# Patient Record
Sex: Female | Born: 1995 | Race: Black or African American | Hispanic: No | Marital: Single | Smoking: Never smoker
Health system: Southern US, Community
[De-identification: ages and names within clinical notes are randomized; demographics above are authoritative.]

## PROBLEM LIST (undated history)

## (undated) DIAGNOSIS — J45909 Unspecified asthma, uncomplicated: Secondary | ICD-10-CM

## (undated) DIAGNOSIS — M5430 Sciatica, unspecified side: Secondary | ICD-10-CM

---

## 2015-12-25 ENCOUNTER — Emergency Department (HOSPITAL_COMMUNITY): Payer: Federal, State, Local not specified - PPO

## 2015-12-25 ENCOUNTER — Emergency Department (HOSPITAL_COMMUNITY)
Admission: EM | Admit: 2015-12-25 | Discharge: 2015-12-25 | Disposition: A | Discharge status: Home / Self Care | Payer: Federal, State, Local not specified - PPO | Attending: Emergency Medicine | Admitting: Emergency Medicine

## 2015-12-25 ENCOUNTER — Encounter (HOSPITAL_COMMUNITY): Payer: Self-pay | Admitting: Emergency Medicine

## 2015-12-25 DIAGNOSIS — J3489 Other specified disorders of nose and nasal sinuses: Secondary | ICD-10-CM | POA: Diagnosis not present

## 2015-12-25 DIAGNOSIS — R0981 Nasal congestion: Secondary | ICD-10-CM | POA: Diagnosis not present

## 2015-12-25 DIAGNOSIS — J45901 Unspecified asthma with (acute) exacerbation: Secondary | ICD-10-CM | POA: Insufficient documentation

## 2015-12-25 DIAGNOSIS — R05 Cough: Secondary | ICD-10-CM | POA: Diagnosis not present

## 2015-12-25 DIAGNOSIS — Z880 Allergy status to penicillin: Secondary | ICD-10-CM | POA: Insufficient documentation

## 2015-12-25 HISTORY — DX: Unspecified asthma, uncomplicated: J45.909

## 2015-12-25 MED ORDER — FLUTICASONE PROPIONATE 50 MCG/ACT NA SUSP: 2.0000 | Freq: Every day | NASAL | Status: AC

## 2015-12-25 MED ORDER — PSEUDOEPHEDRINE HCL 30 MG PO TABS: 30.0000 mg | ORAL_TABLET | Freq: Four times a day (QID) | ORAL | Status: AC | PRN

## 2015-12-25 NOTE — Discharge Instructions (Signed)
Your chest xray is normal today. Your vital signs are normal. Take an allergy medication of your choice, DAILY, you can try zyrtec, Claritin, allegra, which ever has worked for you in the past. Flonase daily for congestion. Use nasal saline spray every 2 hrs for congestion. You can also try sudafed. Use your inhaler as needed. Please follow up with a primary care doctor or and urgent care.   Allergic Rhinitis Allergic rhinitis is when the mucous membranes in the nose respond to allergens. Allergens are particles in the air that cause your body to have an allergic reaction. This causes you to release allergic antibodies. Through a chain of events, these eventually cause you to release histamine into the blood stream. Although meant to protect the body, it is this release of histamine that causes your discomfort, such as frequent sneezing, congestion, and an itchy, runny nose.  CAUSES Seasonal allergic rhinitis (hay fever) is caused by pollen allergens that may come from grasses, trees, and weeds. Year-round allergic rhinitis (perennial allergic rhinitis) is caused by allergens such as house dust mites, pet dander, and mold spores. SYMPTOMS  Nasal stuffiness (congestion).  Itchy, runny nose with sneezing and tearing of the eyes. DIAGNOSIS Your health care provider can help you determine the allergen or allergens that trigger your symptoms. If you and your health care provider are unable to determine the allergen, skin or blood testing may be used. Your health care provider will diagnose your condition after taking your health history and performing a physical exam. Your health care provider may assess you for other related conditions, such as asthma, pink eye, or an ear infection. TREATMENT Allergic rhinitis does not have a cure, but it can be controlled by:  Medicines that block allergy symptoms. These may include allergy shots, nasal sprays, and oral antihistamines.  Avoiding the allergen. Hay  fever may often be treated with antihistamines in pill or nasal spray forms. Antihistamines block the effects of histamine. There are over-the-counter medicines that may help with nasal congestion and swelling around the eyes. Check with your health care provider before taking or giving this medicine. If avoiding the allergen or the medicine prescribed do not work, there are many new medicines your health care provider can prescribe. Stronger medicine may be used if initial measures are ineffective. Desensitizing injections can be used if medicine and avoidance does not work. Desensitization is when a patient is given ongoing shots until the body becomes less sensitive to the allergen. Make sure you follow up with your health care provider if problems continue. HOME CARE INSTRUCTIONS It is not possible to completely avoid allergens, but you can reduce your symptoms by taking steps to limit your exposure to them. It helps to know exactly what you are allergic to so that you can avoid your specific triggers. SEEK MEDICAL CARE IF:  You have a fever.  You develop a cough that does not stop easily (persistent).  You have shortness of breath.  You start wheezing.  Symptoms interfere with normal daily activities.   This information is not intended to replace advice given to you by your health care provider. Make sure you discuss any questions you have with your health care provider.   Document Released: 09/06/2001 Document Revised: 01/02/2015 Document Reviewed: 08/19/2013 Elsevier Interactive Patient Education [employer] Inc.

## 2015-12-25 NOTE — ED Notes (Signed)
Pt c/o productive cough with yellow/dark yellow sputum, congestion in head and chest x 1.5 months.

## 2015-12-25 NOTE — ED Provider Notes (Signed)
CSN: [id number]     Arrival date & time 12/25/15  1001 History  By signing my name below, I, Tanda RockersMargaux Venter, attest that this documentation has been prepared under the direction and in the presence of Tameisha Covell, PA-C. Electronically Signed: Tanda RockersMargaux Venter, ED Scribe. 12/25/2015. 12:09 PM.  Chief Complaint  Patient presents with  . Cough   The history is provided by the patient. No language interpreter was used.     HPI Comments: Christie Cortez is a 19 y.o. female with hx asthma who presents to the Emergency Department complaining of gradual onset, constant, asthma exacerbation including nasal congestion and productive cough with yellow sputum x 1.5 months, gradually worsening. Pt has been seen in the ED in [place] for the same symptoms and was given a prescription for Prednisone and allergy medication without relief. Pt cannot recall what the name of the allergy medication was. She has also used her albuterol inhaler 4-5 times since onset without relief. Denies fever, chills, or any other associated symptoms.  Past Medical History  Diagnosis Date  . Asthma    History reviewed. No pertinent past surgical history. History reviewed. No pertinent family history. Social History  Substance Use Topics  . Smoking status: Never Smoker   . Smokeless tobacco: None  . Alcohol Use: Yes     Comment: rarely   OB History    No data available     Review of Systems  Constitutional: Negative for fever and chills.  HENT: Positive for congestion.   Respiratory: Positive for cough.    Allergies  Coconut oil and Penicillins  Home Medications   Prior to Admission medications   Not on File   Triage Vitals: BP 124/81 mmHg  Pulse 85  Temp(Src) 98 F (36.7 C) (Oral)  Resp 18  SpO2 100%  LMP 12/25/2015   Physical Exam  Constitutional: She is oriented to person, place, and time. She appears well-developed and well-nourished. No distress.  HENT:  Head: Normocephalic and atraumatic.   Right Ear: Tympanic membrane, external ear and ear canal normal.  Left Ear: Tympanic membrane, external ear and ear canal normal.  Nose: Mucosal edema and rhinorrhea present.  Mouth/Throat: Uvula is midline and oropharynx is clear and moist. No oropharyngeal exudate.  Eyes: Conjunctivae and EOM are normal.  Neck: Neck supple. No tracheal deviation present.  Cardiovascular: Normal rate, regular rhythm and normal heart sounds.   Pulmonary/Chest: Effort normal and breath sounds normal. No respiratory distress. She has no wheezes. She has no rales.  Musculoskeletal: Normal range of motion.  Neurological: She is alert and oriented to person, place, and time.  Skin: Skin is warm and dry.  Psychiatric: She has a normal mood and affect. Her behavior is normal.  Nursing note and vitals reviewed.   ED Course  Procedures (including critical care time)   DIAGNOSTIC STUDIES: Oxygen Saturation is 100% on RA, normal by my interpretation.    COORDINATION OF CARE: 12:03 PM-Discussed treatment plan which includes allergy medication with pt at bedside and pt agreed to plan.   Labs Review Labs Reviewed - No data to display  Imaging Review Dg Chest 2 View  12/25/2015  CLINICAL DATA:  Productive cough and chest congestion. EXAM: CHEST  2 VIEW COMPARISON:  None. FINDINGS: There is slight peribronchial thickening. The lungs are otherwise clear. Heart size and vascularity are normal. No effusions. Normal bones. IMPRESSION: Slight bronchitic changes. Electronically Signed   By: Francene BoyersJames  Maxwell M.D.   On: 12/25/2015 11:47   I  have personally reviewed and evaluated these images as part of my medical decision-making.   EKG Interpretation None      MDM   Final diagnoses:  Nasal congestion   Pt with hx of asthma, here for nasal congestion and cough for close to 2 months. She has been seen in ED in [place] for the same and prescribed prednisone course and allergy medications which she states are not  helping. Pt currently not taking anything for congestion. Using inhaler with no relief of her symptoms. Lungs are clear. VS are normal. Main finding on exam is nasal congestion. Pt's oxygen sat is 100% on RA. Afebrile. Most likely cough from allergies and post nasal drainage. Home with allergy medication, flonase, sudafed, follow up with pcp.   Filed Vitals:   12/25/15 1053 12/25/15 1220  BP: 124/81 125/76  Pulse: 85 83  Temp: 98 F (36.7 C)   TempSrc: Oral   Resp: 18 18  SpO2: 100% 100%    I personally performed the services described in this documentation, which was scribed in my presence. The recorded information has been reviewed and is accurate.     Jaynie Crumble, PA-C 12/25/15 1323  Benjiman Core, MD 12/25/15 [phone]

## 2017-05-12 IMAGING — CR DG CHEST 2V
2 series · 2 of 2 positions shown · non-contrast
Comparison: None.

CLINICAL DATA: Productive cough and chest congestion.

EXAM:
CHEST  2 VIEW

[w chest pa]
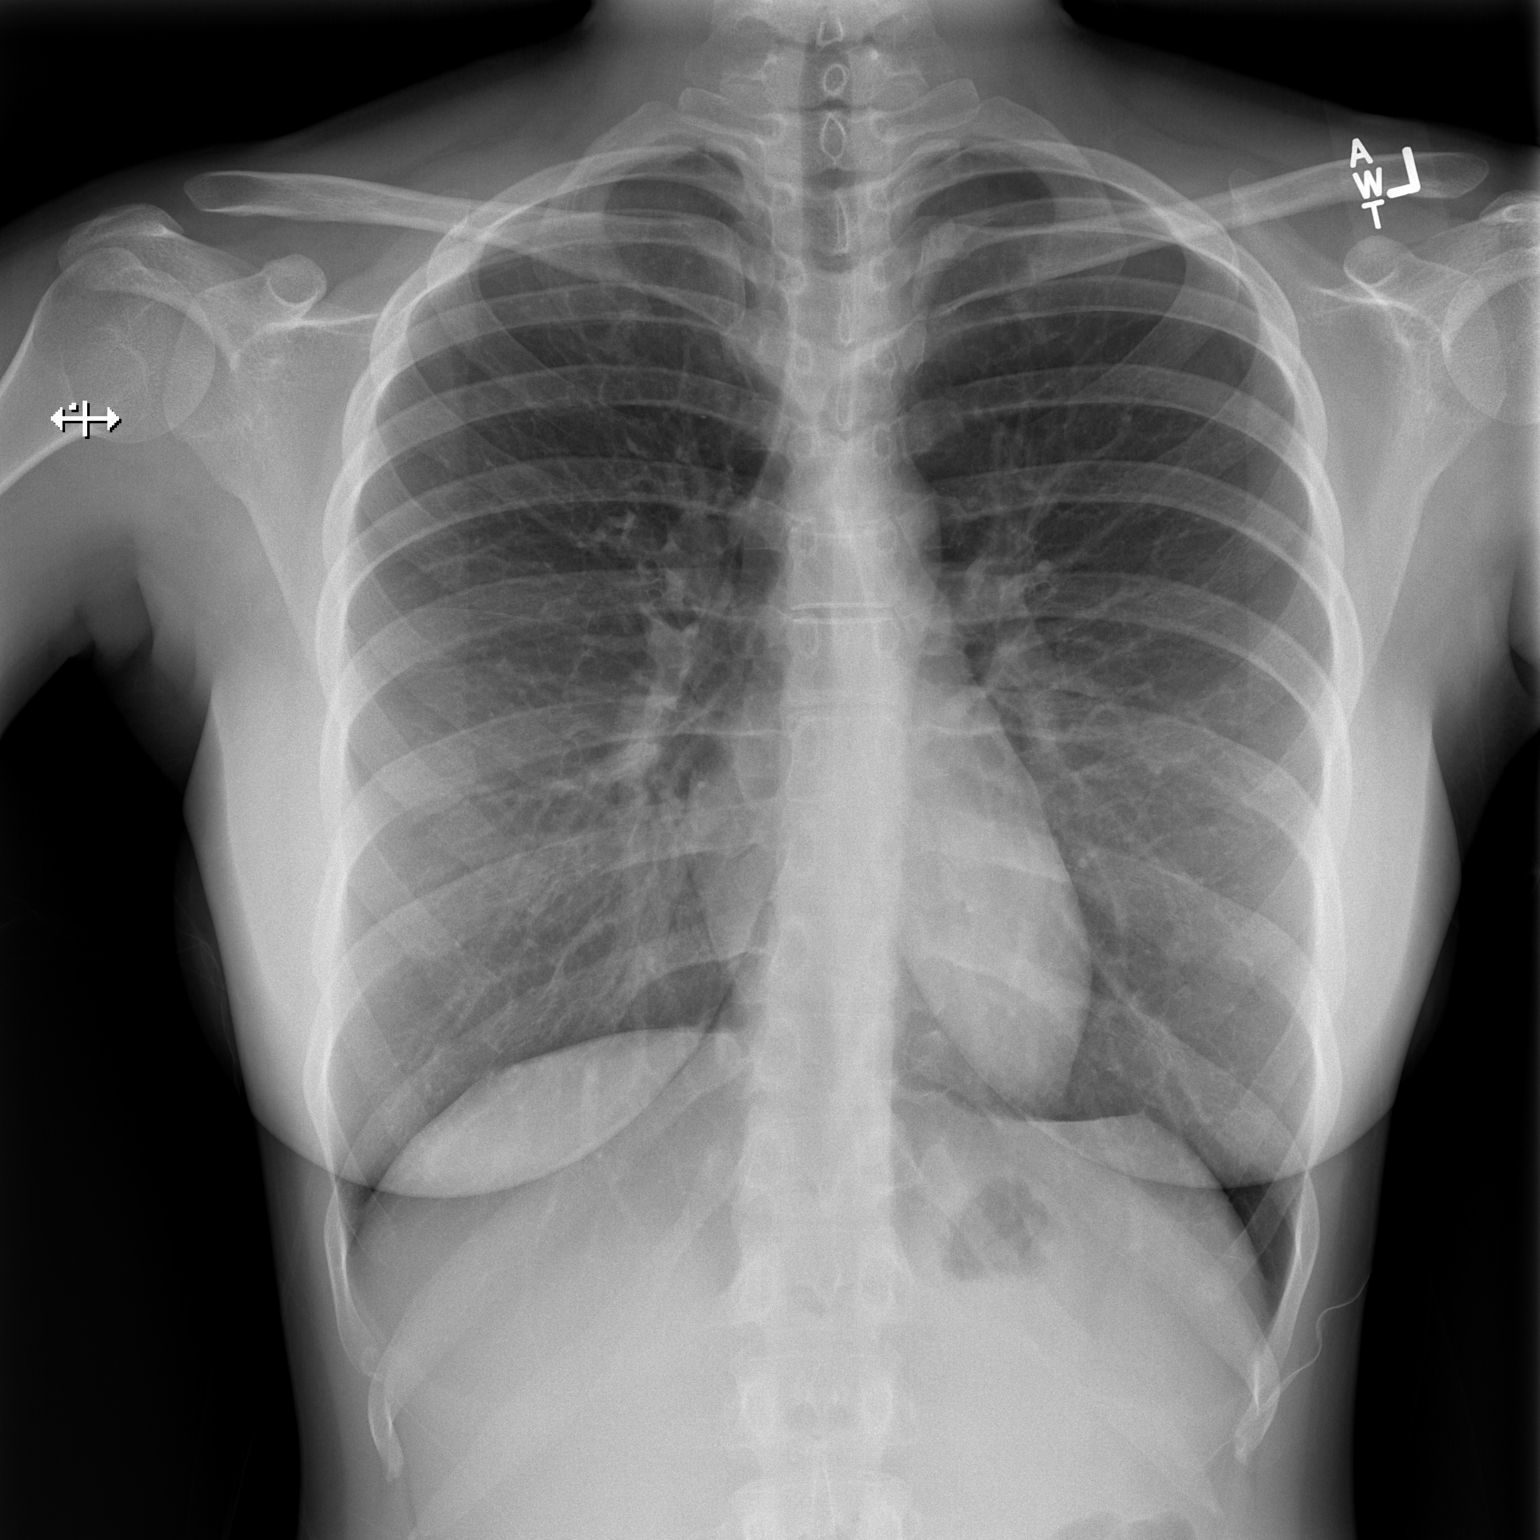

[w chest lat]
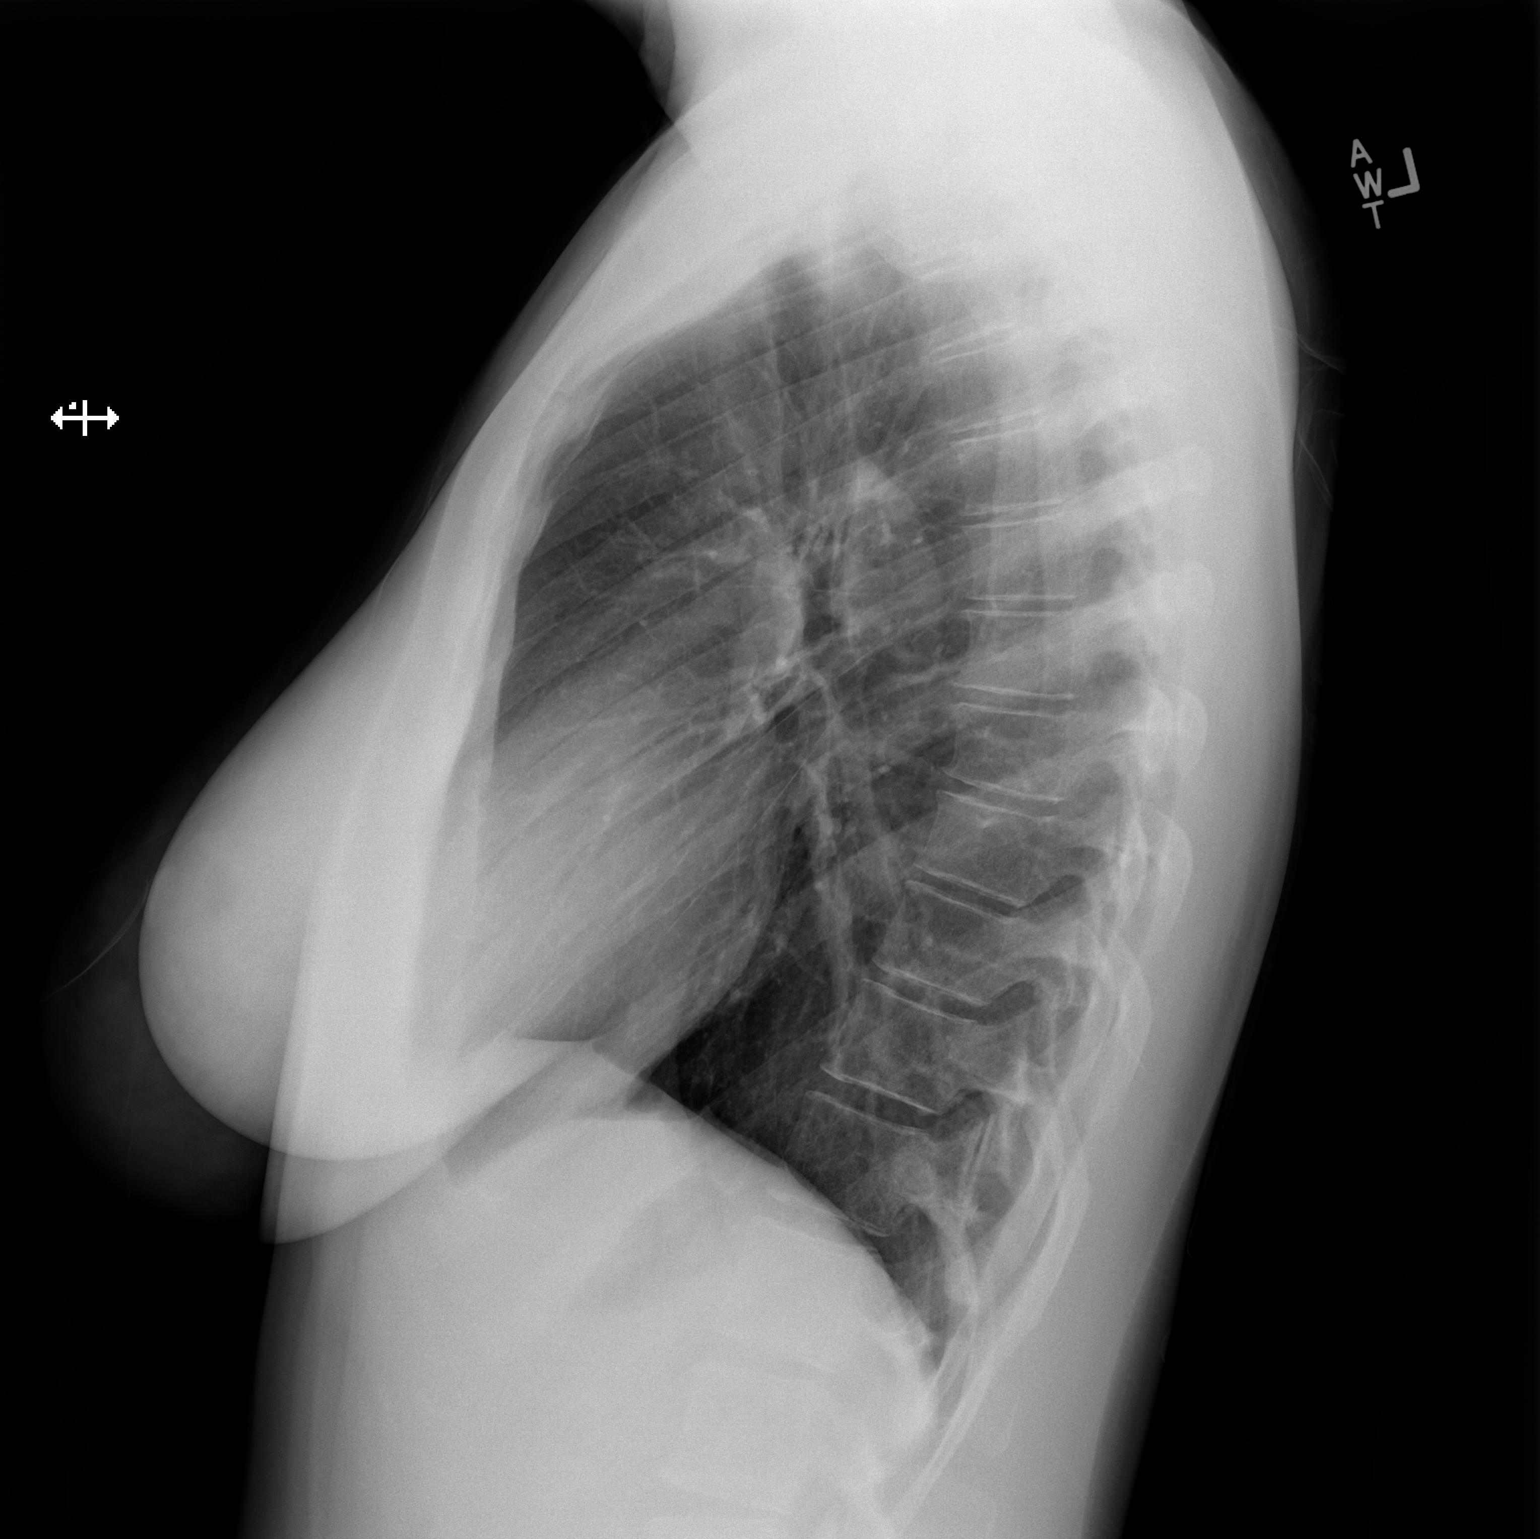

[2 of 2 positions shown; findings below may reference images not displayed]

FINDINGS: There is slight peribronchial thickening. The lungs are otherwise
clear. Heart size and vascularity are normal. No effusions. Normal
bones.
IMPRESSION: Slight bronchitic changes.

## 2019-07-19 ENCOUNTER — Other Ambulatory Visit: Payer: Self-pay

## 2019-07-19 DIAGNOSIS — Z20822 Contact with and (suspected) exposure to covid-19: Secondary | ICD-10-CM

## 2019-07-22 LAB — NOVEL CORONAVIRUS, NAA: SARS-CoV-2, NAA: DETECTED — AB

## 2019-07-30 ENCOUNTER — Other Ambulatory Visit: Payer: Self-pay

## 2019-07-30 DIAGNOSIS — Z20822 Contact with and (suspected) exposure to covid-19: Secondary | ICD-10-CM

## 2019-07-31 LAB — NOVEL CORONAVIRUS, NAA: SARS-CoV-2, NAA: NOT DETECTED

## 2024-12-14 ENCOUNTER — Encounter (HOSPITAL_BASED_OUTPATIENT_CLINIC_OR_DEPARTMENT_OTHER): Payer: Self-pay | Admitting: Emergency Medicine

## 2024-12-14 ENCOUNTER — Other Ambulatory Visit: Payer: Self-pay

## 2024-12-14 ENCOUNTER — Emergency Department (HOSPITAL_BASED_OUTPATIENT_CLINIC_OR_DEPARTMENT_OTHER)
Admission: EM | Admit: 2024-12-14 | Discharge: 2024-12-14 | Disposition: A | Discharge status: Home / Self Care | Attending: Emergency Medicine | Admitting: Emergency Medicine

## 2024-12-14 DIAGNOSIS — M545 Low back pain, unspecified: Secondary | ICD-10-CM | POA: Diagnosis present

## 2024-12-14 DIAGNOSIS — M5442 Lumbago with sciatica, left side: Secondary | ICD-10-CM | POA: Insufficient documentation

## 2024-12-14 HISTORY — DX: Sciatica, unspecified side: M54.30

## 2024-12-14 LAB — URINALYSIS, MICROSCOPIC (REFLEX): RBC / HPF: >50 RBC/hpf (ref 0–5)

## 2024-12-14 LAB — URINALYSIS, ROUTINE W REFLEX MICROSCOPIC
Bilirubin Urine: NEGATIVE
Glucose, UA: NEGATIVE mg/dL
Ketones, ur: NEGATIVE mg/dL
Leukocytes,Ua: NEGATIVE
Nitrite: NEGATIVE
Protein, ur: NEGATIVE mg/dL
Specific Gravity, Urine: 1.02 (ref 1.005–1.030)
pH: 6 (ref 5.0–8.0)

## 2024-12-14 LAB — PREGNANCY, URINE: Preg Test, Ur: NEGATIVE

## 2024-12-14 MED ORDER — KETOROLAC TROMETHAMINE 15 MG/ML IJ SOLN
15.0000 mg | Freq: Once | INTRAMUSCULAR | Status: AC
  Administered 2024-12-14: 15 mg via INTRAMUSCULAR
  Filled 2024-12-14: qty 1

## 2024-12-14 MED ORDER — PREDNISONE 10 MG PO TABS: ORAL_TABLET | ORAL | 0 refills | Status: AC

## 2024-12-14 MED ORDER — DEXAMETHASONE SOD PHOSPHATE PF 10 MG/ML IJ SOLN
10.0000 mg | Freq: Once | INTRAMUSCULAR | Status: AC
  Administered 2024-12-14: 10 mg via INTRAMUSCULAR

## 2024-12-14 NOTE — ED Notes (Signed)
 Report received from Anissa, CHAR[facility] FUNDRAISER. Assuming patient care at this time.
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
## 2024-12-14 NOTE — Discharge Instructions (Addendum)
 Your back and leg pain seems due to an irritated nerve.   Please engage in light physical activity (like walking) to prevent your back pain from worsening and to prevent stiffness. Refrain from bedrest which can make your pain worse.   You may use up to 600mg  ibuprofen every 6 hours as needed for pain.  Do not exceed 2.4g of ibuprofen per day.  You were given a dose of a similar medication here today, your next dose can be no sooner than 8 PM tonight.  You may take up to 1000mg  of tylenol every 6 hours as needed for pain.  Do not take more then 4g per day.    You may use a heating pack on your back to help with the pain.  You may use your home muscle relaxer (Flexeril OR Tizanidine) before bed as needed for muscle pain. This medication can be sedating. Do not drive or operate heavy machinery after taking this medicine. Do not drink alcohol or take other sedating medications when taking this medicine for safety reasons.  Keep this out of reach of small children.    You have been prescribed prednisone  which is an anti-inflammatory that can help with nerve pain.  Please take this medication as prescribed for the next 6 days starting tomorrow morning (40mg  on day 1, 30mg  on days 2 and 3, 20mg  on days 4 and 5, 10mg  on day 6). Take this medication in the morning with breakfast, as taking it at night may make it hard to sleep. If you are a diabetic, please monitor your blood sugars closely on this medication, as it can cause your blood sugar to rise.   Please schedule follow-up appointment with your PCP or the spine specialist listed below if pain is not starting to improve within the next 2 weeks.  You may benefit from physical therapy.  You can call to schedule a appointment with a physical therapy office of your choice, or have your PCP refer you.  Return to the ER if you have loss of bowel or bladder control, you develop fever, you have numbness in your groin, or if you have any other new or  concerning symptoms.
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
